# Patient Record
Sex: Female | Born: 1975 | Race: White | Hispanic: No | Marital: Married | Smoking: Never smoker
Health system: Southern US, Community
[De-identification: ages and names within clinical notes are randomized; demographics above are authoritative.]

## PROBLEM LIST (undated history)

## (undated) DIAGNOSIS — Z8719 Personal history of other diseases of the digestive system: Secondary | ICD-10-CM

## (undated) DIAGNOSIS — E049 Nontoxic goiter, unspecified: Secondary | ICD-10-CM

## (undated) DIAGNOSIS — F32A Depression, unspecified: Secondary | ICD-10-CM

## (undated) DIAGNOSIS — I10 Essential (primary) hypertension: Secondary | ICD-10-CM

## (undated) DIAGNOSIS — K589 Irritable bowel syndrome without diarrhea: Secondary | ICD-10-CM

## (undated) HISTORY — DX: Depression, unspecified: F32.A

## (undated) HISTORY — PX: ABLATION: SHX5711

## (undated) HISTORY — DX: Essential (primary) hypertension: I10

## (undated) HISTORY — PX: CHOLECYSTECTOMY: SHX55

## (undated) HISTORY — DX: Irritable bowel syndrome, unspecified: K58.9

## (undated) HISTORY — DX: Personal history of other diseases of the digestive system: Z87.19

---

## 2016-04-29 ENCOUNTER — Other Ambulatory Visit: Payer: Self-pay | Admitting: Nurse Practitioner

## 2016-04-29 DIAGNOSIS — Z1231 Encounter for screening mammogram for malignant neoplasm of breast: Secondary | ICD-10-CM

## 2016-06-10 ENCOUNTER — Ambulatory Visit
Admission: RE | Admit: 2016-06-10 | Discharge: 2016-06-10 | Disposition: A | Discharge status: Home / Self Care | Payer: BLUE CROSS/BLUE SHIELD | Source: Ambulatory Visit | Attending: Nurse Practitioner | Admitting: Nurse Practitioner

## 2016-06-10 DIAGNOSIS — Z1231 Encounter for screening mammogram for malignant neoplasm of breast: Secondary | ICD-10-CM

## 2016-07-05 ENCOUNTER — Other Ambulatory Visit: Payer: Self-pay | Admitting: Nurse Practitioner

## 2016-07-05 DIAGNOSIS — R928 Other abnormal and inconclusive findings on diagnostic imaging of breast: Secondary | ICD-10-CM

## 2016-07-08 ENCOUNTER — Other Ambulatory Visit: Payer: BLUE CROSS/BLUE SHIELD

## 2016-07-15 ENCOUNTER — Other Ambulatory Visit: Payer: Self-pay | Admitting: Nurse Practitioner

## 2016-07-15 ENCOUNTER — Ambulatory Visit
Admission: RE | Admit: 2016-07-15 | Discharge: 2016-07-15 | Disposition: A | Discharge status: Home / Self Care | Payer: BLUE CROSS/BLUE SHIELD | Source: Ambulatory Visit | Attending: Nurse Practitioner | Admitting: Nurse Practitioner

## 2016-07-15 DIAGNOSIS — R928 Other abnormal and inconclusive findings on diagnostic imaging of breast: Secondary | ICD-10-CM

## 2016-07-15 DIAGNOSIS — N6489 Other specified disorders of breast: Secondary | ICD-10-CM

## 2016-07-22 ENCOUNTER — Other Ambulatory Visit: Payer: Self-pay | Admitting: Nurse Practitioner

## 2016-07-22 ENCOUNTER — Ambulatory Visit
Admission: RE | Admit: 2016-07-22 | Discharge: 2016-07-22 | Disposition: A | Discharge status: Home / Self Care | Payer: BLUE CROSS/BLUE SHIELD | Source: Ambulatory Visit | Attending: Nurse Practitioner | Admitting: Nurse Practitioner

## 2016-07-22 DIAGNOSIS — N6489 Other specified disorders of breast: Secondary | ICD-10-CM

## 2016-08-05 ENCOUNTER — Ambulatory Visit: Payer: Self-pay | Admitting: General Surgery

## 2016-08-05 DIAGNOSIS — R921 Mammographic calcification found on diagnostic imaging of breast: Secondary | ICD-10-CM

## 2016-08-10 ENCOUNTER — Other Ambulatory Visit: Payer: Self-pay | Admitting: General Surgery

## 2016-08-10 DIAGNOSIS — R921 Mammographic calcification found on diagnostic imaging of breast: Secondary | ICD-10-CM

## 2016-08-19 HISTORY — PX: BREAST EXCISIONAL BIOPSY: SUR124

## 2016-09-01 NOTE — Pre-Procedure Instructions (Signed)
Janene Madeiraonya Crate  09/01/2016      [facility] [id number] - [place], [place] - [address] [facility] [address] [place] [id number] Phone: [phone] Fax: [phone]    Your procedure is scheduled on   Thursday  09/09/16  [facility] at [facility]30 A.M.  Call this number if you have problems the morning of surgery:  [phone]   Remember:  Do not eat food or drink liquids after midnight.  Take these medicines the morning of surgery with A SIP OF WATER   NONE       (STOP 7 DAYS PRIOR TO SURGERY ASPIRIN OR ASPIRIN PRODUCTS, IBUPROFEN/ ADVIL/ MOTRIN, GOODY POWDERS, BC'S, HERBAL MEDICINES)   Do not wear jewelry, make-up or nail polish.  Do not wear lotions, powders, or perfumes, or deoderant.  Do not shave 48 hours prior to surgery.  Men may shave face and neck.  Do not bring valuables to the hospital.  [facility] is not responsible for any belongings or valuables.  Contacts, dentures or bridgework may not be worn into surgery.  Leave your suitcase in the car.  After surgery it may be brought to your room.  For patients admitted to the hospital, discharge time will be determined by your treatment team.  Patients discharged the day of surgery will not be allowed to drive home.   Name and phone number of your driver:    Special instructions:  [place] - Preparing for Surgery  Before surgery, you can play an important role.  Because skin is not sterile, your skin needs to be as free of germs as possible.  You can reduce the number of germs on you skin by washing with CHG (chlorahexidine gluconate) soap before surgery.  CHG is an antiseptic cleaner which kills germs and bonds with the skin to continue killing germs even after washing.  Please DO NOT use if you have an allergy to CHG or antibacterial soaps.  If your skin becomes reddened/irritated stop using the CHG and inform your nurse when you arrive at Short  Stay.  Do not shave (including legs and underarms) for at least 48 hours prior to the first CHG shower.  You may shave your face.  Please follow these instructions carefully:   1.  Shower with CHG Soap the night before surgery and the                                morning of Surgery.  2.  If you choose to wash your hair, wash your hair first as usual with your       normal shampoo.  3.  After you shampoo, rinse your hair and body thoroughly to remove the                      Shampoo.  4.  Use CHG as you would any other liquid soap.  You can apply chg directly       to the skin and wash gently with scrungie or a clean washcloth.  5.  Apply the CHG Soap to your body ONLY FROM THE NECK DOWN.        Do not use on open wounds or open sores.  Avoid contact with your eyes,       ears, mouth and genitals (private parts).  Wash genitals (private parts)  with your normal soap.  6.  Wash thoroughly, paying special attention to the area where your surgery        will be performed.  7.  Thoroughly rinse your body with warm water from the neck down.  8.  DO NOT shower/wash with your normal soap after using and rinsing off       the CHG Soap.  9.  Pat yourself dry with a clean towel.            10.  Wear clean pajamas.            11.  Place clean sheets on your bed the night of your first shower and do not        sleep with pets.  Day of Surgery  Do not apply any lotions/deoderants the morning of surgery.  Please wear clean clothes to the hospital/surgery center.    Please read over the following fact sheets that you were given. Pain Booklet

## 2016-09-02 ENCOUNTER — Encounter (HOSPITAL_COMMUNITY): Payer: Self-pay | Admitting: *Deleted

## 2016-09-02 ENCOUNTER — Encounter (HOSPITAL_COMMUNITY)
Admission: RE | Admit: 2016-09-02 | Discharge: 2016-09-02 | Disposition: A | Discharge status: Home / Self Care | Payer: BLUE CROSS/BLUE SHIELD | Source: Ambulatory Visit | Attending: General Surgery | Admitting: General Surgery

## 2016-09-02 DIAGNOSIS — Z01812 Encounter for preprocedural laboratory examination: Secondary | ICD-10-CM | POA: Diagnosis not present

## 2016-09-02 HISTORY — DX: Nontoxic goiter, unspecified: E04.9

## 2016-09-02 LAB — CBC
HCT: 39.7 % (ref 36.0–46.0)
Hemoglobin: 13.3 g/dL (ref 12.0–15.0)
MCH: 31.1 pg (ref 26.0–34.0)
MCHC: 33.5 g/dL (ref 30.0–36.0)
MCV: 92.8 fL (ref 78.0–100.0)
PLATELETS: 226 10*3/uL (ref 150–400)
RBC: 4.28 MIL/uL (ref 3.87–5.11)
RDW: 12.4 % (ref 11.5–15.5)
WBC: 5 10*3/uL (ref 4.0–10.5)

## 2016-09-02 LAB — HCG, SERUM, QUALITATIVE: Preg, Serum: NEGATIVE

## 2016-09-02 NOTE — Progress Notes (Signed)
PCP: Belva AgeeSusan Weeks, PA @ [facility] [place], [place]

## 2016-09-08 ENCOUNTER — Ambulatory Visit
Admission: RE | Admit: 2016-09-08 | Discharge: 2016-09-08 | Disposition: A | Discharge status: Home / Self Care | Payer: BLUE CROSS/BLUE SHIELD | Source: Ambulatory Visit | Attending: General Surgery | Admitting: General Surgery

## 2016-09-08 DIAGNOSIS — R921 Mammographic calcification found on diagnostic imaging of breast: Secondary | ICD-10-CM

## 2016-09-09 ENCOUNTER — Ambulatory Visit
Admission: RE | Admit: 2016-09-09 | Discharge: 2016-09-09 | Disposition: A | Discharge status: Home / Self Care | Payer: BLUE CROSS/BLUE SHIELD | Source: Ambulatory Visit | Attending: General Surgery | Admitting: General Surgery

## 2016-09-09 ENCOUNTER — Encounter (HOSPITAL_COMMUNITY)
Admission: RE | Disposition: A | Discharge status: Home / Self Care | Payer: Self-pay | Source: Ambulatory Visit | Attending: General Surgery

## 2016-09-09 ENCOUNTER — Encounter (HOSPITAL_COMMUNITY): Payer: Self-pay | Admitting: *Deleted

## 2016-09-09 ENCOUNTER — Ambulatory Visit (HOSPITAL_COMMUNITY)
Admission: RE | Admit: 2016-09-09 | Discharge: 2016-09-09 | Disposition: A | Discharge status: Home / Self Care | Payer: BLUE CROSS/BLUE SHIELD | Source: Ambulatory Visit | Attending: General Surgery | Admitting: General Surgery

## 2016-09-09 ENCOUNTER — Ambulatory Visit (HOSPITAL_COMMUNITY): Payer: BLUE CROSS/BLUE SHIELD | Admitting: Anesthesiology

## 2016-09-09 DIAGNOSIS — Z803 Family history of malignant neoplasm of breast: Secondary | ICD-10-CM | POA: Diagnosis not present

## 2016-09-09 DIAGNOSIS — N6092 Unspecified benign mammary dysplasia of left breast: Secondary | ICD-10-CM | POA: Insufficient documentation

## 2016-09-09 DIAGNOSIS — N6489 Other specified disorders of breast: Secondary | ICD-10-CM | POA: Insufficient documentation

## 2016-09-09 DIAGNOSIS — R921 Mammographic calcification found on diagnostic imaging of breast: Secondary | ICD-10-CM | POA: Diagnosis present

## 2016-09-09 HISTORY — PX: BREAST LUMPECTOMY WITH RADIOACTIVE SEED LOCALIZATION: SHX6424

## 2016-09-09 SURGERY — BREAST LUMPECTOMY WITH RADIOACTIVE SEED LOCALIZATION
Anesthesia: General | Site: Breast | Laterality: Left

## 2016-09-09 MED ORDER — LACTATED RINGERS IV SOLN
INTRAVENOUS | Status: DC
  Administered 2016-09-09: 09:00:00 via INTRAVENOUS

## 2016-09-09 MED ORDER — CELECOXIB 200 MG PO CAPS
ORAL_CAPSULE | ORAL | Status: AC
  Filled 2016-09-09: qty 2

## 2016-09-09 MED ORDER — 0.9 % SODIUM CHLORIDE (POUR BTL) OPTIME
TOPICAL | Status: DC | PRN
  Administered 2016-09-09: 1000 mL

## 2016-09-09 MED ORDER — DEXAMETHASONE SODIUM PHOSPHATE 10 MG/ML IJ SOLN
INTRAMUSCULAR | Status: AC
  Filled 2016-09-09: qty 1

## 2016-09-09 MED ORDER — SCOPOLAMINE 1 MG/3DAYS TD PT72
MEDICATED_PATCH | TRANSDERMAL | Status: AC
  Filled 2016-09-09: qty 1

## 2016-09-09 MED ORDER — CEFAZOLIN SODIUM-DEXTROSE 2-4 GM/100ML-% IV SOLN
2.0000 g | INTRAVENOUS | Status: AC
  Administered 2016-09-09: 2 g via INTRAVENOUS

## 2016-09-09 MED ORDER — FENTANYL CITRATE (PF) 100 MCG/2ML IJ SOLN
INTRAMUSCULAR | Status: DC | PRN
  Administered 2016-09-09: 25 ug via INTRAVENOUS
  Administered 2016-09-09: 75 ug via INTRAVENOUS

## 2016-09-09 MED ORDER — MIDAZOLAM HCL 2 MG/2ML IJ SOLN
INTRAMUSCULAR | Status: AC
  Filled 2016-09-09: qty 2

## 2016-09-09 MED ORDER — CHLORHEXIDINE GLUCONATE CLOTH 2 % EX PADS: 6.0000 | MEDICATED_PAD | Freq: Once | CUTANEOUS | Status: DC

## 2016-09-09 MED ORDER — GABAPENTIN 300 MG PO CAPS
ORAL_CAPSULE | ORAL | Status: AC
  Filled 2016-09-09: qty 1

## 2016-09-09 MED ORDER — SCOPOLAMINE 1 MG/3DAYS TD PT72
1.0000 | MEDICATED_PATCH | TRANSDERMAL | Status: DC
  Administered 2016-09-09: 1.5 mg via TRANSDERMAL
  Administered 2016-09-09: 1 via TRANSDERMAL
  Filled 2016-09-09: qty 1

## 2016-09-09 MED ORDER — PROPOFOL 10 MG/ML IV BOLUS
INTRAVENOUS | Status: DC | PRN
  Administered 2016-09-09: 50 mg via INTRAVENOUS
  Administered 2016-09-09: 150 mg via INTRAVENOUS

## 2016-09-09 MED ORDER — LACTATED RINGERS IV SOLN: INTRAVENOUS | Status: DC

## 2016-09-09 MED ORDER — GABAPENTIN 300 MG PO CAPS
300.0000 mg | ORAL_CAPSULE | ORAL | Status: AC
  Administered 2016-09-09: 300 mg via ORAL

## 2016-09-09 MED ORDER — PROMETHAZINE HCL 25 MG/ML IJ SOLN: 6.2500 mg | INTRAMUSCULAR | Status: DC | PRN

## 2016-09-09 MED ORDER — PROPOFOL 10 MG/ML IV BOLUS
INTRAVENOUS | Status: AC
  Filled 2016-09-09: qty 20

## 2016-09-09 MED ORDER — BUPIVACAINE HCL (PF) 0.25 % IJ SOLN
INTRAMUSCULAR | Status: DC | PRN
  Administered 2016-09-09: 30 mL

## 2016-09-09 MED ORDER — FENTANYL CITRATE (PF) 100 MCG/2ML IJ SOLN
INTRAMUSCULAR | Status: AC
  Filled 2016-09-09: qty 2

## 2016-09-09 MED ORDER — ACETAMINOPHEN 500 MG PO TABS
ORAL_TABLET | ORAL | Status: AC
  Filled 2016-09-09: qty 2

## 2016-09-09 MED ORDER — FENTANYL CITRATE (PF) 100 MCG/2ML IJ SOLN: 25.0000 ug | INTRAMUSCULAR | Status: DC | PRN

## 2016-09-09 MED ORDER — LIDOCAINE 2% (20 MG/ML) 5 ML SYRINGE
INTRAMUSCULAR | Status: DC | PRN
  Administered 2016-09-09: 100 mg via INTRAVENOUS

## 2016-09-09 MED ORDER — BUPIVACAINE HCL (PF) 0.25 % IJ SOLN
INTRAMUSCULAR | Status: AC
  Filled 2016-09-09: qty 30

## 2016-09-09 MED ORDER — HYDROCODONE-ACETAMINOPHEN 5-325 MG PO TABS: 1.0000 | ORAL_TABLET | ORAL | 0 refills | Status: DC | PRN

## 2016-09-09 MED ORDER — MIDAZOLAM HCL 5 MG/5ML IJ SOLN
INTRAMUSCULAR | Status: DC | PRN
  Administered 2016-09-09: 2 mg via INTRAVENOUS

## 2016-09-09 MED ORDER — DEXAMETHASONE SODIUM PHOSPHATE 10 MG/ML IJ SOLN
INTRAMUSCULAR | Status: DC | PRN
  Administered 2016-09-09: 10 mg via INTRAVENOUS

## 2016-09-09 MED ORDER — CELECOXIB 200 MG PO CAPS
400.0000 mg | ORAL_CAPSULE | ORAL | Status: AC
  Administered 2016-09-09: 400 mg via ORAL

## 2016-09-09 MED ORDER — ACETAMINOPHEN 500 MG PO TABS
1000.0000 mg | ORAL_TABLET | ORAL | Status: AC
  Administered 2016-09-09: 1000 mg via ORAL

## 2016-09-09 MED ORDER — ONDANSETRON HCL 4 MG/2ML IJ SOLN
INTRAMUSCULAR | Status: DC | PRN
  Administered 2016-09-09: 4 mg via INTRAVENOUS

## 2016-09-09 MED ORDER — CEFAZOLIN SODIUM-DEXTROSE 2-4 GM/100ML-% IV SOLN
INTRAVENOUS | Status: AC
  Filled 2016-09-09: qty 100

## 2016-09-09 SURGICAL SUPPLY — 43 items
APPLIER CLIP 9.375 MED OPEN (MISCELLANEOUS)
BLADE SURG 15 STRL LF DISP TIS (BLADE) ×1 IMPLANT
BLADE SURG 15 STRL SS (BLADE) ×2
CANISTER SUCT 3000ML PPV (MISCELLANEOUS) ×3 IMPLANT
CHLORAPREP W/TINT 26ML (MISCELLANEOUS) ×3 IMPLANT
CLIP APPLIE 9.375 MED OPEN (MISCELLANEOUS) IMPLANT
CONT SPEC 4OZ CLIKSEAL STRL BL (MISCELLANEOUS) ×6 IMPLANT
COVER PROBE W GEL 5X96 (DRAPES) ×3 IMPLANT
COVER SURGICAL LIGHT HANDLE (MISCELLANEOUS) ×3 IMPLANT
DERMABOND ADVANCED (GAUZE/BANDAGES/DRESSINGS) ×2
DERMABOND ADVANCED .7 DNX12 (GAUZE/BANDAGES/DRESSINGS) ×1 IMPLANT
DEVICE DUBIN SPECIMEN MAMMOGRA (MISCELLANEOUS) ×3 IMPLANT
DRAPE CHEST BREAST 15X10 FENES (DRAPES) ×3 IMPLANT
DRAPE UTILITY XL STRL (DRAPES) ×3 IMPLANT
ELECT COATED BLADE 2.86 ST (ELECTRODE) ×3 IMPLANT
ELECT REM PT RETURN 9FT ADLT (ELECTROSURGICAL) ×3
ELECTRODE REM PT RTRN 9FT ADLT (ELECTROSURGICAL) ×1 IMPLANT
GLOVE BIO SURGEON STRL SZ 6.5 (GLOVE) ×2 IMPLANT
GLOVE BIO SURGEON STRL SZ7.5 (GLOVE) ×6 IMPLANT
GLOVE BIO SURGEONS STRL SZ 6.5 (GLOVE) ×1
GLOVE BIOGEL PI IND STRL 7.0 (GLOVE) ×1 IMPLANT
GLOVE BIOGEL PI INDICATOR 7.0 (GLOVE) ×2
GLOVE SURG SS PI 6.5 STRL IVOR (GLOVE) ×3 IMPLANT
GOWN STRL REUS W/ TWL LRG LVL3 (GOWN DISPOSABLE) ×3 IMPLANT
GOWN STRL REUS W/TWL LRG LVL3 (GOWN DISPOSABLE) ×6
KIT BASIN OR (CUSTOM PROCEDURE TRAY) ×3 IMPLANT
KIT MARKER MARGIN INK (KITS) ×3 IMPLANT
LIGHT WAVEGUIDE WIDE FLAT (MISCELLANEOUS) IMPLANT
NEEDLE HYPO 25X1 1.5 SAFETY (NEEDLE) ×3 IMPLANT
NS IRRIG 1000ML POUR BTL (IV SOLUTION) ×3 IMPLANT
PACK SURGICAL SETUP 50X90 (CUSTOM PROCEDURE TRAY) ×3 IMPLANT
PENCIL BUTTON HOLSTER BLD 10FT (ELECTRODE) ×3 IMPLANT
SPONGE LAP 18X18 X RAY DECT (DISPOSABLE) ×3 IMPLANT
SUT MNCRL AB 4-0 PS2 18 (SUTURE) ×6 IMPLANT
SUT SILK 2 0 SH (SUTURE) IMPLANT
SUT VIC AB 3-0 SH 18 (SUTURE) ×3 IMPLANT
SYR BULB 3OZ (MISCELLANEOUS) ×3 IMPLANT
SYR CONTROL 10ML LL (SYRINGE) ×3 IMPLANT
TOWEL OR 17X24 6PK STRL BLUE (TOWEL DISPOSABLE) IMPLANT
TOWEL OR 17X26 10 PK STRL BLUE (TOWEL DISPOSABLE) ×3 IMPLANT
TUBE CONNECTING 12'X1/4 (SUCTIONS) ×1
TUBE CONNECTING 12X1/4 (SUCTIONS) ×2 IMPLANT
YANKAUER SUCT BULB TIP NO VENT (SUCTIONS) ×3 IMPLANT

## 2016-09-09 NOTE — Transfer of Care (Signed)
Immediate Anesthesia Transfer of Care Note  Patient: Ruth Allison  Procedure(s) Performed: Procedure(s): LEFT BREAST LUMPECTOMY WITH RADIOACTIVE SEED LOCALIZATION (Left)  Patient Location: PACU  Anesthesia Type:General  Level of Consciousness: awake, alert , oriented and patient cooperative  Airway & Oxygen Therapy: Patient Spontanous Breathing  Post-op Assessment: Report given to RN and Post -op Vital signs reviewed and stable  Post vital signs: Reviewed and stable  Last Vitals:  Vitals:   09/09/16 0824 09/09/16 1303  BP: (!) 144/80 (!) (P) 146/94  Pulse: 91   Resp: 20   Temp: 36.8 C (P) 36.7 C    Last Pain:  Vitals:   09/09/16 0824  TempSrc: Oral         Complications: No apparent anesthesia complications

## 2016-09-09 NOTE — Progress Notes (Signed)
Care of pt assumed by MA Avish Torry RN 

## 2016-09-09 NOTE — H&P (Signed)
Ruth Allison  Location: [facility] Patient #: [id number] DOB: 01/27/1976 Undefined / Language: [place] / Race: White Female   History of Present Illness  The patient is a 41 year old female who presents with a breast mass. We are asked to see the patient in consultation by Dr. Darl PikesSusan weeks to evaluate her for a discordant biopsy. The patient is a 41 year old white female who recently went for a routine screening mammogram. At that time she was found to have some abnormal calcifications present in the upper portion of the left breast. She states that she has been told she had some calcifications on previous mammograms. She denies any breast pain or discharge from the nipple. The area of calcification covered about 7 cm. Part of this area was biopsied and showed fibrocystic disease with calcification. The radiologist felt this was discordant and felt that a larger resection should be done. She does have a family history of breast cancer in 2 paternal aunts. She does not smoke or take hormone replacement.   Past Surgical History  Breast Biopsy  Left. Gallbladder Surgery - Laparoscopic  Oral Surgery   Diagnostic Studies History  Colonoscopy  never Mammogram  within last year Pap Smear  1-5 years ago  Allergies  No Known Allergies   Medication History  Lexapro (10MG  Tablet, Oral daily) Active. Medications Reconciled  Social History  Alcohol use  Occasional alcohol use. Caffeine use  Coffee. No drug use  Tobacco use  Never smoker.  Family History  Hypertension  Father, Mother. Migraine Headache  Mother.  Pregnancy / Birth History  Age at menarche  11 years. Contraceptive History  Oral contraceptives. Gravida  1 Irregular periods  Maternal age  43-30 Para  1  Other Problems  Cholelithiasis  Thyroid Disease     Review of Systems  General Not Present- Appetite Loss, Chills, Fatigue, Fever, Night Sweats, Weight Gain and Weight  Loss. Skin Not Present- Change in Wart/Mole, Dryness, Hives, Jaundice, New Lesions, Non-Healing Wounds, Rash and Ulcer. HEENT Present- Seasonal Allergies and Wears glasses/contact lenses. Not Present- Earache, Hearing Loss, Hoarseness, Nose Bleed, Oral Ulcers, Ringing in the Ears, Sinus Pain, Sore Throat, Visual Disturbances and Yellow Eyes. Respiratory Not Present- Bloody sputum, Chronic Cough, Difficulty Breathing, Snoring and Wheezing. Breast Not Present- Breast Mass, Breast Pain, Nipple Discharge and Skin Changes. Cardiovascular Not Present- Chest Pain, Difficulty Breathing Lying Down, Leg Cramps, Palpitations, Rapid Heart Rate, Shortness of Breath and Swelling of Extremities. Gastrointestinal Not Present- Abdominal Pain, Bloating, Bloody Stool, Change in Bowel Habits, Chronic diarrhea, Constipation, Difficulty Swallowing, Excessive gas, Gets full quickly at meals, Hemorrhoids, Indigestion, Nausea, Rectal Pain and Vomiting. Female Genitourinary Not Present- Frequency, Nocturia, Painful Urination, Pelvic Pain and Urgency. Musculoskeletal Not Present- Back Pain, Joint Pain, Joint Stiffness, Muscle Pain, Muscle Weakness and Swelling of Extremities. Neurological Not Present- Decreased Memory, Fainting, Headaches, Numbness, Seizures, Tingling, Tremor, Trouble walking and Weakness. Psychiatric Not Present- Anxiety, Bipolar, Change in Sleep Pattern, Depression, Fearful and Frequent crying. Endocrine Not Present- Cold Intolerance, Excessive Hunger, Hair Changes, Heat Intolerance, Hot flashes and New Diabetes. Hematology Not Present- Blood Thinners, Easy Bruising, Excessive bleeding, Gland problems, HIV and Persistent Infections.  Vitals  Weight: 154 lb Height: 64in Body Surface Area: 1.75 m Body Mass Index: 26.43 kg/m  Temp.: 98.3F  Pulse: 82 (Regular)  BP: 138/82 (Sitting, Left Arm, Standard)       Physical Exam General Mental Status-Alert. General Appearance-Consistent  with stated age. Hydration-Well hydrated. Voice-Normal.  Head and Neck Head-normocephalic, atraumatic  with no lesions or palpable masses. Trachea-midline. Thyroid Gland Characteristics - normal size and consistency.  Eye Eyeball - Bilateral-Extraocular movements intact. Sclera/Conjunctiva - Bilateral-No scleral icterus.  Chest and Lung Exam Chest and lung exam reveals -quiet, even and easy respiratory effort with no use of accessory muscles and on auscultation, normal breath sounds, no adventitious sounds and normal vocal resonance. Inspection Chest Wall - Normal. Back - normal.  Breast Note: There is no palpable mass in either breast. There is no palpable axillary, supraclavicular, or cervical lymphadenopathy.   Cardiovascular Cardiovascular examination reveals -normal heart sounds, regular rate and rhythm with no murmurs and normal pedal pulses bilaterally.  Abdomen Inspection Inspection of the abdomen reveals - No Hernias. Skin - Scar - no surgical scars. Palpation/Percussion Palpation and Percussion of the abdomen reveal - Soft, Non Tender, No Rebound tenderness, No Rigidity (guarding) and No hepatosplenomegaly. Auscultation Auscultation of the abdomen reveals - Bowel sounds normal.  Neurologic Neurologic evaluation reveals -alert and oriented x 3 with no impairment of recent or remote memory. Mental Status-Normal.  Musculoskeletal Normal Exam - Left-Upper Extremity Strength Normal and Lower Extremity Strength Normal. Normal Exam - Right-Upper Extremity Strength Normal and Lower Extremity Strength Normal.  Lymphatic Head & Neck  General Head & Neck Lymphatics: Bilateral - Description - Normal. Axillary  General Axillary Region: Bilateral - Description - Normal. Tenderness - Non Tender. Femoral & Inguinal  Generalized Femoral & Inguinal Lymphatics: Bilateral - Description - Normal. Tenderness - Non Tender.    Assessment & Plan   BREAST CALCIFICATION, LEFT (R92.1) Impression: The patient appears to have some abnormal calcifications in the upper portion of the left breast covering an area of about 7 cm. This was biopsied and found to be fibrocystic disease with calcifications but the radiologist felt that the results were discordant. Because of this the recommendation is to have more of this area removed. I believe this is a reasonable thing to do but I do not think the entire 7 cm area of calcification needs to be removed if this all appears to be benign. She would require a left breast radioactive seed localized lumpectomy. I have discussed with her in detail the risks and benefits of the operation as well as some of the technical aspects and she understands and wishes to proceed. If a representative section of this area is all benign then I think the calcifications may be able to be followed with imaging. Given her breast density she may also require MRI as part of her screening.

## 2016-09-09 NOTE — Op Note (Signed)
09/09/2016  12:53 PM  PATIENT:  Ruth Allison  41 y.o. female  PRE-OPERATIVE DIAGNOSIS:  LEFT BREAST CALCIFICATIONS  POST-OPERATIVE DIAGNOSIS:  LEFT BREAST CALCIFICATIONS  PROCEDURE:  Procedure(s): LEFT BREAST LUMPECTOMY WITH RADIOACTIVE SEED LOCALIZATION (Left)  SURGEON:  Surgeon(s) and Role:    * Griselda MinerPaul Toth III, MD - Primary  PHYSICIAN ASSISTANT:   ASSISTANTS: none   ANESTHESIA:   local and general  EBL:  No intake/output data recorded.  BLOOD ADMINISTERED:none  DRAINS: none   LOCAL MEDICATIONS USED:  MARCAINE     SPECIMEN:  Source of Specimen:  left breast tissue with additional deep and deep lateral margins  DISPOSITION OF SPECIMEN:  PATHOLOGY  COUNTS:  YES  TOURNIQUET:  * No tourniquets in log *  DICTATION: .Dragon Dictation   After informed consent was obtained the patient was brought to the operating room and placed in the supine position on the operating room table. After adequate induction of general anesthesia the patient's left breast was prepped with ChloraPrep, allowed to dry, and draped in usual sterile manner. An appropriate Timeout was performed. Previously an I-125 seed was placed in the upper outer quadrant of the left breast to mark an area of calcification that was previously biopsied and thought to be benign but was felt to be discordant by the radiologist. The neoprobe was set to I-125 in the area of radioactivity was readily identified. I decided to access this area from the lateral edge of the breast. This area was infiltrated with quarter percent Marcaine. A small vertically oriented incision was made on the very lateral aspect of the breast in the axilla with a 15 blade knife. The incision was carried through the skin and subcutaneous tissue sharply with electrocautery. Dissection was then carried towards the radioactive seed under the direction of the neoprobe. Once I approached the radioactive seed and then removed a circular portion of breast tissue  sharply around the radioactive seed with the electrocautery. Once the specimen was removed it was oriented with the appropriate paint colors. A specimen radiograph was obtained that showed the clip and seed to be within the specimen. After consultation with the radiologist I decided to take an additional deep and deep lateral margin. These were marked appropriately. Hemostasis was achieved using the Bovie electrocautery. The wound was then infiltrated with quarter percent Marcaine and irrigated with saline. The deep layer of the wound was then closed with layers of interrupted 3-0 Vicryl stitches. The skin was then closed with interrupted 4-0 Monocryl subcuticular stitches. Dermabond dressings were applied. The patient tolerated the procedure well. At the end of the case all needle sponge and instrument counts were correct. The patient was then awakened and taken to recovery in stable condition.  PLAN OF CARE: Discharge to home after PACU  PATIENT DISPOSITION:  PACU - hemodynamically stable.   Delay start of Pharmacological VTE agent (>24hrs) due to surgical blood loss or risk of bleeding: not applicable

## 2016-09-09 NOTE — Anesthesia Preprocedure Evaluation (Addendum)
Anesthesia Evaluation  Patient identified by MRN, date of birth, ID band Patient awake    Reviewed: Allergy & Precautions, NPO status , Patient's Chart, lab work & pertinent test results  Airway Mallampati: II  TM Distance: >3 FB Neck ROM: Full    Dental  (+) Teeth Intact, Dental Advisory Given   Pulmonary neg pulmonary ROS,    Pulmonary exam normal breath sounds clear to auscultation       Cardiovascular Exercise Tolerance: Good negative cardio ROS Normal cardiovascular exam Rhythm:Regular Rate:Normal     Neuro/Psych PSYCHIATRIC DISORDERS Depression negative neurological ROS     GI/Hepatic negative GI ROS, Neg liver ROS,   Endo/Other  negative endocrine ROS  Renal/GU negative Renal ROS     Musculoskeletal negative musculoskeletal ROS (+)   Abdominal   Peds  Hematology negative hematology ROS (+)   Anesthesia Other Findings Day of surgery medications reviewed with the patient.  Reproductive/Obstetrics negative OB ROS                            Anesthesia Physical Anesthesia Plan  ASA: II  Anesthesia Plan: General   Post-op Pain Management:    Induction: Intravenous  Airway Management Planned: LMA  Additional Equipment:   Intra-op Plan:   Post-operative Plan: Extubation in OR  Informed Consent: I have reviewed the patients History and Physical, chart, labs and discussed the procedure including the risks, benefits and alternatives for the proposed anesthesia with the patient or authorized representative who has indicated his/her understanding and acceptance.   Dental advisory given  Plan Discussed with: CRNA  Anesthesia Plan Comments: (Risks/benefits of general anesthesia discussed with patient including risk of damage to teeth, lips, gum, and tongue, nausea/vomiting, allergic reactions to medications, and the possibility of heart attack, stroke and death.  All patient  questions answered.  Patient wishes to proceed.)        Anesthesia Quick Evaluation

## 2016-09-09 NOTE — Anesthesia Procedure Notes (Addendum)
Procedure Name: LMA Insertion Date/Time: 09/09/2016 11:37 AM Performed by: Faustino CongressWHITE, Neko Boyajian TENA Davison Ohms Pre-anesthesia Checklist: Patient identified, Emergency Drugs available, Suction available and Patient being monitored Patient Re-evaluated:Patient Re-evaluated prior to inductionOxygen Delivery Method: Circle System Utilized Preoxygenation: Pre-oxygenation with 100% oxygen Intubation Type: IV induction LMA: LMA inserted LMA Size: 4.0 Number of attempts: 1 Airway Equipment and Method: Bite block Placement Confirmation: positive ETCO2 Tube secured with: Tape Dental Injury: Teeth and Oropharynx as per pre-operative assessment  Comments: LMA placed by Elliot Dallyiana Huggins, SRNA

## 2016-09-09 NOTE — Interval H&P Note (Signed)
History and Physical Interval Note:  09/09/2016 10:00 AM  Ruth Allison  has presented today for surgery, with the diagnosis of LEFT BREAST CALCIFICATIONS  The various methods of treatment have been discussed with the patient and family. After consideration of risks, benefits and other options for treatment, the patient has consented to  Procedure(s): LEFT BREAST LUMPECTOMY WITH RADIOACTIVE SEED LOCALIZATION (Left) as a surgical intervention .  The patient's history has been reviewed, patient examined, no change in status, stable for surgery.  I have reviewed the patient's chart and labs.  Questions were answered to the patient's satisfaction.     TOTH III,PAUL S

## 2016-09-10 ENCOUNTER — Encounter (HOSPITAL_COMMUNITY): Payer: Self-pay | Admitting: General Surgery

## 2016-09-10 NOTE — Anesthesia Postprocedure Evaluation (Signed)
Anesthesia Post Note  Patient: Janene Madeiraonya Tunison  Procedure(s) Performed: Procedure(s) (LRB): LEFT BREAST LUMPECTOMY WITH RADIOACTIVE SEED LOCALIZATION (Left)  Patient location during evaluation: PACU Anesthesia Type: General Level of consciousness: awake and alert Pain management: pain level controlled Vital Signs Assessment: post-procedure vital signs reviewed and stable Respiratory status: spontaneous breathing, nonlabored ventilation, respiratory function stable and patient connected to nasal cannula oxygen Cardiovascular status: blood pressure returned to baseline and stable Postop Assessment: no signs of nausea or vomiting Anesthetic complications: no       Last Vitals:  Vitals:   09/09/16 1317 09/09/16 1330  BP: (!) 142/92   Pulse: 81 74  Resp: 14 13  Temp:  36.7 C    Last Pain:  Vitals:   09/09/16 0824  TempSrc: Oral                 Cecile HearingStephen Edward Turk

## 2017-06-15 ENCOUNTER — Other Ambulatory Visit: Payer: Self-pay | Admitting: General Surgery

## 2017-06-15 DIAGNOSIS — Z139 Encounter for screening, unspecified: Secondary | ICD-10-CM

## 2017-07-01 ENCOUNTER — Other Ambulatory Visit: Payer: Self-pay | Admitting: General Surgery

## 2017-07-01 DIAGNOSIS — Z1231 Encounter for screening mammogram for malignant neoplasm of breast: Secondary | ICD-10-CM

## 2017-07-06 NOTE — Progress Notes (Signed)
[facility]  Telephone:(336) [phone] Fax:(336) [phone]     ID: Ruth Allison DOB: 11/15/1975  MR#: [id number]  JYN#:[id number]#:[id number]  Patient Care Team: Marrian SalvageGarrett, Ruth, Cordelia PochePA-C as PCP - General (Physician Assistant) Tashima Scarpulla, Valentino HueGustav C, MD as Consulting Physician (Oncology) Griselda Mineroth, Paul III, MD as Consulting Physician (General Surgery) Josephine CablesBivens, Carl, MD as Referring Physician (Internal Medicine) OTHER MD:  CHIEF COMPLAINT: Atypical lobular hyperplasia  CURRENT TREATMENT: Tamoxifen   HISTORY OF CURRENT ILLNESS: Ruth Madeiraonya Allison had routine screening mammography on 06/10/2016 showing a possible abnormality in the left breast. She underwent bilateral diagnostic mammography with tomography and left breast ultrasonography at [facility] on 07/15/2016 showing: Architectural distortion with associated calcifications, spanning approximately 7 x 6 cm, in the upper-outer quadrant of the left breast, at posterior depth, without sonographic correlate. Left axilla was evaluated with ultrasound showing no enlarged or morphologically abnormal lymph nodes.  Accordingly on 07/22/2016 she proceeded to biopsy of the left UOQ breast lession in question. The pathology from this procedure showed ([id number]): fibrocytic changes with calcifications. There is no malignancy identified. -- This was found to be discordant by radiology.  She then underwent a left breast lumpectomy and biopsy of the posterior and lateral margins ([id number]) on 09/09/2016 showing: Complex sclerosing lesion with calcifications. Focal lobular neoplasia (atypical lobular hyperplasia). Fibrocytic change and ususal ductal hyperplasia with calcifications. Pseudoangiomatous stromal hyperplasia (PASH). No malignancy identified.   The patient's subsequent history is as detailed below.  INTERVAL HISTORY: Ruth Allison was evaluated in the high risk clinic on 07/07/2017 accompanied by her husband, Ruth MostCharles.  REVIEW OF SYSTEMS: Ruth Allison reports that  she has had mild reflux and IBS in the past. She reports that she take stool softeners occasionally. She notes that they discontinued the medication that she was taking for it, and she is currently not taking medication. She notes that she uses her PCP Ruth ParadiseKaren Allison as her gynecologist as well. She denies unusual headaches, visual changes, nausea, vomiting, or dizziness. There has been no unusual cough, phlegm production, or pleurisy. This been no change in bowel or bladder habits. She denies unexplained fatigue or unexplained weight loss, bleeding, rash, or fever. A detailed review of systems was otherwise stable.    PAST MEDICAL HISTORY: Past Medical History:  Diagnosis Date  . Goiter   She reports HTN and anxiety. She has enlarged thyroids that she follows up with Internal medicine Dr. Jolyne [place]iv[place]. She has mild reflux issues. She notes having IBS years ago.   PAST SURGICAL HISTORY: Past Surgical History:  Procedure Laterality Date  . ABLATION    . BREAST LUMPECTOMY WITH RADIOACTIVE SEED LOCALIZATION Left 09/09/2016   Procedure: LEFT BREAST LUMPECTOMY WITH RADIOACTIVE SEED LOCALIZATION;  Surgeon: Chevis PrettyPaul Toth III, MD;  Location: [facility] OR;  Service: General;  Laterality: Left;  . CHOLECYSTECTOMY    Ablasion around 2013. She had her gall bladder removed. She still has her appendix, tonsils, uterus and ovaries.  FAMILY HISTORY No family history on file. Her father is alive at 4167 with no medical issues. Her father had 4 sisters, 2 of whom had breast cancer diagnosed in their 7650's and have passed away. He also had 5 brothers, none of whom had prostate or pancreatic cancer. Her mother alive at 365 with no history of cancer. Her mother has 2 siblings; one brother had prostate cancer. She denies other breast or ovarian cancers in the family.    GYNECOLOGIC HISTORY:  No LMP recorded. Patient has had an ablation.   Menarche: 42  years old Age at first live birth: 42 years old GXP1 LMP: 2013 She  had an ablation with no periods since/ minor spotting Contraceptive:no HRT: no    SOCIAL HISTORY:  She is a Armed forces operational officer. Her husband, Ruth Allison is a Conservator, museum/gallery for the [employer]. Their son, Ruth Allison is 93 years old. She notes that he is exceptional in school.  She notes that she visits church, but doesn't belong to one.     ADVANCED DIRECTIVES:    HEALTH MAINTENANCE: Social History   Tobacco Use  . Smoking status: Never Smoker  . Smokeless tobacco: Never Used  Substance Use Topics  . Alcohol use: Yes    Comment: 2-3 times a week, total 6 drinks  . Drug use: No     Colonoscopy: no  PAP: August 2018 normal  Bone density: no   No Known Allergies  Current Outpatient Medications  Medication Sig Dispense Refill  . docusate sodium (STOOL SOFTENER) 100 MG capsule Take 200 mg by mouth daily as needed for mild constipation.    Marland Kitchen escitalopram (LEXAPRO) 10 MG tablet Take 10 mg by mouth every evening.  11  . ibuprofen (ADVIL,MOTRIN) 200 MG tablet Take 400 mg by mouth every 6 (six) hours as needed for mild pain or moderate pain.    Marland Kitchen losartan (COZAAR) 50 MG tablet TK 1 T PO QD  3   No current facility-administered medications for this visit.     OBJECTIVE: Young white woman in no acute distress  Vitals:   07/07/17 1555  BP: 130/90  Pulse: 86  Resp: 18  Temp: 98.6 F (37 C)  SpO2: 100%     Body mass index is 28.91 kg/m.   Wt Readings from Last 3 Encounters:  07/07/17 168 lb 6.4 oz (76.4 kg)  09/09/16 155 lb (70.3 kg)  09/02/16 155 lb 11.2 oz (70.6 kg)      ECOG FS:0 - Asymptomatic  Ocular: Sclerae unicteric, pupils round and equal Ear-nose-throat: Oropharynx clear and moist Lymphatic: No cervical or supraclavicular adenopathy Lungs no rales or rhonchi Heart regular rate and rhythm Abd soft, nontender, positive bowel sounds MSK no focal spinal tenderness, no joint edema Neuro: non-focal, well-oriented, appropriate affect Breasts: The right breast is  unremarkable.  The left breast is status post lumpectomy.  The incision has healed very nicely.  The cosmetic result is excellent.  There are no suspicious findings.  Both axillae are benign.   LAB RESULTS:  CMP  No results found for: NA, K, CL, CO2, GLUCOSE, BUN, CREATININE, CALCIUM, PROT, ALBUMIN, AST, ALT, ALKPHOS, BILITOT, GFRNONAA, GFRAA  No results found for: TOTALPROTELP, ALBUMINELP, A1GS, A2GS, BETS, BETA2SER, GAMS, MSPIKE, SPEI  No results found for: KPAFRELGTCHN, LAMBDASER, KAPLAMBRATIO  Lab Results  Component Value Date   WBC 5.0 09/02/2016   HGB 13.3 09/02/2016   HCT 39.7 09/02/2016   MCV 92.8 09/02/2016   PLT 226 09/02/2016    @LASTCHEMISTRY @  No results found for: LABCA2  No components found for: ZHYQMV784  No results for input(s): INR in the last 168 hours.  No results found for: LABCA2  No results found for: ONG295  No results found for: MWU132  No results found for: GMW102  No results found for: CA2729  No components found for: HGQUANT  No results found for: CEA1 / No results found for: CEA1   No results found for: AFPTUMOR  No results found for: CHROMOGRNA  No results found for: PSA1  No visits with results within 3 Day(s)  from this visit.  Latest known visit with results is:  Hospital Outpatient Visit on 09/02/2016  Component Date Value Ref Range Status  . WBC 09/02/2016 5.0  4.0 - 10.5 K/uL Final  . RBC 09/02/2016 4.28  3.87 - 5.11 MIL/uL Final  . Hemoglobin 09/02/2016 13.3  12.0 - 15.0 g/dL Final  . HCT 54/02/8118 39.7  36.0 - 46.0 % Final  . MCV 09/02/2016 92.8  78.0 - 100.0 fL Final  . MCH 09/02/2016 31.1  26.0 - 34.0 pg Final  . MCHC 09/02/2016 33.5  30.0 - 36.0 g/dL Final  . RDW 14/78/2956 12.4  11.5 - 15.5 % Final  . Platelets 09/02/2016 226  150 - 400 K/uL Final  . Preg, Serum 09/02/2016 NEGATIVE  NEGATIVE Final   Comment:        THE SENSITIVITY OF THIS METHODOLOGY IS >10 mIU/mL.     (this displays the last labs from the  last 3 days)  No results found for: TOTALPROTELP, ALBUMINELP, A1GS, A2GS, BETS, BETA2SER, GAMS, MSPIKE, SPEI (this displays SPEP labs)  No results found for: KPAFRELGTCHN, LAMBDASER, KAPLAMBRATIO (kappa/lambda light chains)  No results found for: HGBA, HGBA2QUANT, HGBFQUANT, HGBSQUAN (Hemoglobinopathy evaluation)   No results found for: LDH  No results found for: IRON, TIBC, IRONPCTSAT (Iron and TIBC)  No results found for: FERRITIN  Urinalysis No results found for: COLORURINE, APPEARANCEUR, LABSPEC, PHURINE, GLUCOSEU, HGBUR, BILIRUBINUR, KETONESUR, PROTEINUR, UROBILINOGEN, NITRITE, LEUKOCYTESUR   STUDIES: No results found.  ELIGIBLE FOR AVAILABLE RESEARCH PROTOCOL: no  ASSESSMENT: 42 y.o. [place], [place] Woman s/p left upper outer quadrant lumpectomy 09/09/2016 for a complex sclerosing lesion associated with atypical lobular hyperplasia  (a) breast density category C  (b) two paternal aunts with breast cancer diagnosed in their 26's  (c) tamoxifen started 07/07/2017  PLAN: We spent the better part of today's 50-minute appointment discussing the biology of breast cancer in general, and the specifics of atypical lobular hyperplasia [ALH] more specifically. Nichelle understands ALH means, first, relatively unrestricted growth of breast cells and, in addition, some morphologically different features from simple hyperplasia. Atypical lobular hyperplasia can be associated with neoplasia and for that reason lumpectomy was performed.  More than a breast cancer precursor, however,ALH is a marker of breast cancer risk. The risk of developing breast cancer in patients with ALH falls somewhere between one half and 1% per year. The new cancer can develop in either breast. One half of those tumors would be invasive.   Working from the patient's age to the average survival of women today in the [place] I think Isabela can expect to live an additional 40 years. Using the high end of the risk spectrum for  purposes of discussion, this would give her a 40% chance of developing breast cancer in her lifetime. This is approximately 3 times the normal risk. The risk of an invasive breast cancer developing therefore would be 20%.  Morghan has essentially two ways of lowering that risk. One of them is bilateral mastectomies. This is not recommended for this indication and I do not believe it would be covered by insurance even if she wanted it which she doesn't. In addition it does not improve survival.  The other, more reasonable approach would be to take anti-estrogens. In premenopausal women like Karesa this means tamoxifen or raloxifene. We then discussed the possible toxicities, side effects and complications of the tamoxifen group. We discussed the STAR study in detail. All information was given to [place] in writing. Tamoxifen if taken for 5  years would essentially cut the risk of developing a new breast cancer in half.  More specifically, it would cut her risk of developing an invasive breast cancer from 20% to 19%. This means she would have a 90% chance of NOT developing an invasive breast cancer in her lifetime.  We also discussed intensified screening. This could be adding yearly MRI to yearly mammography with tomography. This approach greatly increases sensitivity. Any breast cancer that may develop should be found at the earliest possible stage. However intensified screening has not been documented to improve survival in this population. There are issues regarding cost even if insurance coverage is obtained. There also concerns regarding false positives especially in the first or second year of MRI screening.  We discussed data that shows mammography every 6 months does not improve survival compared with every 12 months-- but doubles the radiation exposure.  We reviewed the increased sensitivity when tomography ("3D") is added to mammography.  Unfortunately I do not believe she qualifies for genetics  testing--I have asked our genetics counselors to confirm.  Takeysha already has biannual breast exams through her surgeon and PCP. She will have annual mammography with tomography. She will start tamoxifen now and take it for 5 years. I believe this is the best strategy for her and she is comfortable with it.  She will start tamoxifen now. I will see her again in about 3 months to make sure she is tolerating it well. If so I will release her to her PCP but make myself available for questions or concerns in the future.  Nayvie has a good understanding of the overall plan. She agrees with it. She knows the goal of treatment in her case is prevention. She will call with any problems that may develop before her next visit here.   Kayon Dozier, Valentino Hue, MD  07/07/17 4:50 PM Medical Oncology and Hematology [facility] [address] [place], [place] [address] Tel. [phone]    Fax. [phone]  This document serves as a record of services personally performed by Ruthann Cancer, MD. It was created on his behalf by Merideth Abbey, a trained medical scribe. The creation of this record is based on the scribe's personal observations and the provider's statements to them.   I have reviewed the above documentation for accuracy and completeness, and I agree with the above.

## 2017-07-07 ENCOUNTER — Inpatient Hospital Stay: Payer: BLUE CROSS/BLUE SHIELD | Attending: Oncology | Admitting: Oncology

## 2017-07-07 DIAGNOSIS — N6092 Unspecified benign mammary dysplasia of left breast: Secondary | ICD-10-CM | POA: Diagnosis not present

## 2017-07-07 DIAGNOSIS — Z79810 Long term (current) use of selective estrogen receptor modulators (SERMs): Secondary | ICD-10-CM | POA: Insufficient documentation

## 2017-07-07 DIAGNOSIS — Z1239 Encounter for other screening for malignant neoplasm of breast: Secondary | ICD-10-CM | POA: Insufficient documentation

## 2017-07-07 DIAGNOSIS — Z803 Family history of malignant neoplasm of breast: Secondary | ICD-10-CM | POA: Insufficient documentation

## 2017-07-07 MED ORDER — TAMOXIFEN CITRATE 20 MG PO TABS: 20.0000 mg | ORAL_TABLET | Freq: Every day | ORAL | 12 refills | Status: AC

## 2017-07-11 ENCOUNTER — Other Ambulatory Visit: Payer: Self-pay | Admitting: Oncology

## 2017-07-11 ENCOUNTER — Encounter: Payer: BLUE CROSS/BLUE SHIELD | Admitting: Oncology

## 2017-07-11 NOTE — Progress Notes (Signed)
Checked with genetics, does not meet criteria for genetics testing

## 2017-08-04 ENCOUNTER — Ambulatory Visit
Admission: RE | Admit: 2017-08-04 | Discharge: 2017-08-04 | Disposition: A | Discharge status: Home / Self Care | Payer: BLUE CROSS/BLUE SHIELD | Source: Ambulatory Visit | Attending: General Surgery | Admitting: General Surgery

## 2017-08-04 DIAGNOSIS — Z1231 Encounter for screening mammogram for malignant neoplasm of breast: Secondary | ICD-10-CM

## 2017-08-05 ENCOUNTER — Other Ambulatory Visit: Payer: Self-pay | Admitting: General Surgery

## 2017-08-05 DIAGNOSIS — R928 Other abnormal and inconclusive findings on diagnostic imaging of breast: Secondary | ICD-10-CM

## 2017-08-11 ENCOUNTER — Ambulatory Visit
Admission: RE | Admit: 2017-08-11 | Discharge: 2017-08-11 | Disposition: A | Discharge status: Home / Self Care | Payer: BLUE CROSS/BLUE SHIELD | Source: Ambulatory Visit | Attending: General Surgery | Admitting: General Surgery

## 2017-08-11 DIAGNOSIS — R928 Other abnormal and inconclusive findings on diagnostic imaging of breast: Secondary | ICD-10-CM

## 2017-09-08 ENCOUNTER — Ambulatory Visit: Payer: BLUE CROSS/BLUE SHIELD | Admitting: Oncology

## 2017-09-08 ENCOUNTER — Telehealth: Payer: Self-pay | Admitting: Oncology

## 2017-09-08 NOTE — Telephone Encounter (Signed)
Called regarding voicemail °

## 2017-11-10 ENCOUNTER — Telehealth: Payer: Self-pay

## 2017-11-10 ENCOUNTER — Inpatient Hospital Stay: Payer: BLUE CROSS/BLUE SHIELD | Admitting: Oncology

## 2017-11-10 NOTE — Telephone Encounter (Signed)
Patient called and requested to cancel her appointment for today. Per 5/223 phone msg return

## 2018-07-27 ENCOUNTER — Other Ambulatory Visit: Payer: Self-pay | Admitting: Physician Assistant

## 2018-07-27 DIAGNOSIS — Z1231 Encounter for screening mammogram for malignant neoplasm of breast: Secondary | ICD-10-CM

## 2018-08-24 ENCOUNTER — Ambulatory Visit
Admission: RE | Admit: 2018-08-24 | Discharge: 2018-08-24 | Disposition: A | Discharge status: Home / Self Care | Payer: BLUE CROSS/BLUE SHIELD | Source: Ambulatory Visit | Attending: Physician Assistant | Admitting: Physician Assistant

## 2018-08-24 DIAGNOSIS — Z1231 Encounter for screening mammogram for malignant neoplasm of breast: Secondary | ICD-10-CM

## 2019-07-19 ENCOUNTER — Other Ambulatory Visit: Payer: Self-pay | Admitting: Physician Assistant

## 2019-07-19 DIAGNOSIS — Z1231 Encounter for screening mammogram for malignant neoplasm of breast: Secondary | ICD-10-CM

## 2019-08-30 ENCOUNTER — Ambulatory Visit
Admission: RE | Admit: 2019-08-30 | Discharge: 2019-08-30 | Disposition: A | Discharge status: Home / Self Care | Payer: BC Managed Care – PPO | Source: Ambulatory Visit | Attending: Physician Assistant | Admitting: Physician Assistant

## 2019-08-30 ENCOUNTER — Other Ambulatory Visit: Payer: Self-pay

## 2019-08-30 DIAGNOSIS — Z1231 Encounter for screening mammogram for malignant neoplasm of breast: Secondary | ICD-10-CM

## 2020-07-31 ENCOUNTER — Other Ambulatory Visit: Payer: Self-pay | Admitting: Physician Assistant

## 2020-07-31 DIAGNOSIS — Z1231 Encounter for screening mammogram for malignant neoplasm of breast: Secondary | ICD-10-CM

## 2020-09-25 ENCOUNTER — Ambulatory Visit: Payer: BC Managed Care – PPO

## 2020-10-09 ENCOUNTER — Other Ambulatory Visit: Payer: Self-pay

## 2020-10-09 ENCOUNTER — Ambulatory Visit
Admission: RE | Admit: 2020-10-09 | Discharge: 2020-10-09 | Disposition: A | Discharge status: Home / Self Care | Payer: BC Managed Care – PPO | Source: Ambulatory Visit | Attending: Physician Assistant | Admitting: Physician Assistant

## 2020-10-09 DIAGNOSIS — Z1231 Encounter for screening mammogram for malignant neoplasm of breast: Secondary | ICD-10-CM

## 2021-10-12 ENCOUNTER — Other Ambulatory Visit: Payer: Self-pay | Admitting: Physician Assistant

## 2021-10-12 DIAGNOSIS — Z1231 Encounter for screening mammogram for malignant neoplasm of breast: Secondary | ICD-10-CM

## 2021-11-06 ENCOUNTER — Encounter: Payer: Self-pay | Admitting: Gastroenterology

## 2021-11-12 ENCOUNTER — Ambulatory Visit
Admission: RE | Admit: 2021-11-12 | Discharge: 2021-11-12 | Disposition: A | Discharge status: Home / Self Care | Payer: BC Managed Care – PPO | Source: Ambulatory Visit | Attending: Physician Assistant | Admitting: Physician Assistant

## 2021-11-12 DIAGNOSIS — Z1231 Encounter for screening mammogram for malignant neoplasm of breast: Secondary | ICD-10-CM

## 2021-12-10 ENCOUNTER — Ambulatory Visit: Payer: BC Managed Care – PPO | Admitting: Gastroenterology

## 2021-12-10 ENCOUNTER — Encounter: Payer: Self-pay | Admitting: Gastroenterology

## 2021-12-10 VITALS — BP 120/70 | HR 69 | Ht 64.0 in | Wt 154.0 lb

## 2021-12-10 DIAGNOSIS — K219 Gastro-esophageal reflux disease without esophagitis: Secondary | ICD-10-CM

## 2021-12-10 DIAGNOSIS — Z1211 Encounter for screening for malignant neoplasm of colon: Secondary | ICD-10-CM | POA: Diagnosis not present

## 2021-12-10 DIAGNOSIS — Z1212 Encounter for screening for malignant neoplasm of rectum: Secondary | ICD-10-CM | POA: Diagnosis not present

## 2021-12-10 NOTE — Patient Instructions (Addendum)
If you are age 48 or older, your body mass index should be between 23-30. Your Body mass index is 26.43 kg/m. If this is out of the aforementioned range listed, please consider follow up with your Primary Care Provider.  If you are age 63 or younger, your body mass index should be between 19-25. Your Body mass index is 26.43 kg/m. If this is out of the aformentioned range listed, please consider follow up with your Primary Care Provider.   You have been scheduled for an endoscopy and colonoscopy. Please follow the written instructions given to you at your visit today. Please pick up your prep supplies at the pharmacy within the next 1-3 days. If you use inhalers (even only as needed), please bring them with you on the day of your procedure.  Continue Pepcid 40 mg once in the morning and Pantoprazole once at a bedtime.  You may increase Pantoprazole to twice daily with breakthrough reflux.  The [facility] providers would like to encourage you to use [facility] to communicate with providers for non-urgent requests or questions.  Due to long hold times on the telephone, sending your provider a message by [facility] may be a faster and more efficient way to get a response.  Please allow 48 business hours for a response.  Please remember that this is for non-urgent requests.   It was my pleasure to provide care to you today. Based on our discussion, I am providing you with my recommendations below:   Thank you for trusting me with your gastrointestinal care!    Tressia Danas, MD, MPH

## 2022-01-22 ENCOUNTER — Encounter: Payer: Self-pay | Admitting: Gastroenterology

## 2022-01-22 ENCOUNTER — Ambulatory Visit (AMBULATORY_SURGERY_CENTER): Payer: BC Managed Care – PPO | Admitting: Gastroenterology

## 2022-01-22 VITALS — BP 140/83 | HR 70 | Temp 98.9°F | Resp 17 | Ht 64.0 in | Wt 154.0 lb

## 2022-01-22 DIAGNOSIS — K317 Polyp of stomach and duodenum: Secondary | ICD-10-CM

## 2022-01-22 DIAGNOSIS — K3189 Other diseases of stomach and duodenum: Secondary | ICD-10-CM | POA: Diagnosis not present

## 2022-01-22 DIAGNOSIS — K219 Gastro-esophageal reflux disease without esophagitis: Secondary | ICD-10-CM | POA: Diagnosis not present

## 2022-01-22 DIAGNOSIS — K2970 Gastritis, unspecified, without bleeding: Secondary | ICD-10-CM

## 2022-01-22 DIAGNOSIS — K319 Disease of stomach and duodenum, unspecified: Secondary | ICD-10-CM | POA: Diagnosis not present

## 2022-01-22 DIAGNOSIS — Z1211 Encounter for screening for malignant neoplasm of colon: Secondary | ICD-10-CM

## 2022-01-22 MED ORDER — PANTOPRAZOLE SODIUM 40 MG PO TBEC: 40.0000 mg | DELAYED_RELEASE_TABLET | Freq: Two times a day (BID) | ORAL | 3 refills | Status: DC

## 2022-01-22 MED ORDER — OMEPRAZOLE 40 MG PO CPDR: 40.0000 mg | DELAYED_RELEASE_CAPSULE | Freq: Two times a day (BID) | ORAL | 3 refills | Status: AC

## 2022-01-22 MED ORDER — SODIUM CHLORIDE 0.9 % IV SOLN: 500.0000 mL | Freq: Once | INTRAVENOUS | Status: DC

## 2022-01-22 NOTE — Progress Notes (Signed)
A and O x3. Report to RN. Tolerated MAC anesthesia well.Teeth unchanged after procedure. 

## 2022-01-22 NOTE — Progress Notes (Signed)
Called to room to assist during endoscopic procedure.  Patient ID and intended procedure confirmed with present staff. Received instructions for my participation in the procedure from the performing physician.  

## 2022-01-22 NOTE — Progress Notes (Signed)
Referring Provider: Marrian Salvage, PA-C Primary Care Physician:  Marrian Salvage, PA-C  Indication for EGD: Reflux with regurgitation Indication for Colonoscopy:  Colon cancer screening  IMPRESSION:  Reflux with regurgitation Need for colon cancer screening Appropriate candidate for monitored anesthesia care  PLAN: EGD and Colonoscopy in the [facility] today   HPI: Ruth Allison is a 46 y.o. female presents for endoscopic evaluation of reflux with regurgitation and for colon cancer screening.  Diagnosed with hiatal hernia on EGD performed a surgery in [place] a few years ago. On omeprazole daily for years. Recent experiencing escalating reflux and regurgitation of partially digested food. Pepcid 40 mg added in the morning and then pantoprazole in the evening with improved control of symptoms. The patient denies alarm symptoms including anemia, dysphagia, early satiety, hematemesis, hematochezia, melena, odynophagia, and weight loss.     No prior colonoscopy.   She has had external hemorrhoids since the birth of her son. Symptomatic with a fullness and discomfort during times of constipation or diarrhea. No bleeding or itching. She is not interested in surgery but saw the brochure for banding in the waiting room.    Materanl grandfather in his 60s. There is no other known family history of colon cancer or polyps. No family history of stomach cancer or other GI malignancy. No family history of inflammatory bowel disease or celiac.      Past Medical History:  Diagnosis Date   Depression    Goiter    History of gallstones    Hypertension    IBS (irritable bowel syndrome)     Past Surgical History:  Procedure Laterality Date   ABLATION     BREAST EXCISIONAL BIOPSY Left 08/2016   benign   BREAST LUMPECTOMY WITH RADIOACTIVE SEED LOCALIZATION Left 09/09/2016   Procedure: LEFT BREAST LUMPECTOMY WITH RADIOACTIVE SEED LOCALIZATION;  Surgeon: Chevis Pretty III, MD;  Location: [facility] OR;  Service:  General;  Laterality: Left;   CHOLECYSTECTOMY      Current Outpatient Medications  Medication Sig Dispense Refill   busPIRone (BUSPAR) 10 MG tablet Take 10 mg by mouth daily.     escitalopram (LEXAPRO) 10 MG tablet Take 10 mg by mouth every evening.  11   famotidine (PEPCID) 40 MG tablet Take 40 mg by mouth daily.     losartan (COZAAR) 50 MG tablet TK 1 T PO QD  3   No current facility-administered medications for this visit.    Allergies as of 01/22/2022   (No Known Allergies)    Family History  Problem Relation Age of Onset   Breast cancer Paternal Aunt        x 2 50's   Colon cancer Maternal Grandfather      Physical Exam: General:   Alert,  well-nourished, pleasant and cooperative in NAD Head:  Normocephalic and atraumatic. Eyes:  Sclera clear, no icterus.   Conjunctiva pink. Mouth:  No deformity or lesions.   Neck:  Supple; no masses or thyromegaly. Lungs:  Clear throughout to auscultation.   No wheezes. Heart:  Regular rate and rhythm; no murmurs. Abdomen:  Soft, non-tender, nondistended, normal bowel sounds, no rebound or guarding.  Msk:  Symmetrical. No boney deformities LAD: No inguinal or umbilical LAD Extremities:  No clubbing or edema. Neurologic:  Alert and  oriented x4;  grossly nonfocal Skin:  No obvious rash or bruise. Psych:  Alert and cooperative. Normal mood and affect.     Studies/Results: No results found.    Oneal Schoenberger L. Orvan Falconer, MD, MPH  01/22/2022, 1:12 PM

## 2022-01-22 NOTE — Op Note (Addendum)
[facility] Patient Name: Ruth Allison Procedure Date: 01/22/2022 2:03 PM MRN: [id number] Endoscopist: Tressia Danas MD, MD Age: 46 Referring MD:  Date of Birth: 12/12/1975 Gender: Female Account #: [id number] Procedure:                Upper GI endoscopy Indications:              Esophageal reflux symptoms that persist despite                            appropriate therapy Medicines:                Monitored Anesthesia Care Procedure:                Pre-Anesthesia Assessment:                           - Prior to the procedure, a History and Physical                            was performed, and patient medications and                            allergies were reviewed. The patient's tolerance of                            previous anesthesia was also reviewed. The risks                            and benefits of the procedure and the sedation                            options and risks were discussed with the patient.                            All questions were answered, and informed consent                            was obtained. Prior Anticoagulants: The patient has                            taken no previous anticoagulant or antiplatelet                            agents. ASA Grade Assessment: II - A patient with                            mild systemic disease. After reviewing the risks                            and benefits, the patient was deemed in                            satisfactory condition to undergo the procedure.  After obtaining informed consent, the endoscope was                            passed under direct vision. Throughout the                            procedure, the patient's blood pressure, pulse, and                            oxygen saturations were monitored continuously. The                            GIF [id number] was introduced through the                            mouth, and advanced to the third part of  duodenum.                            The upper GI endoscopy was accomplished without                            difficulty. The patient tolerated the procedure                            well. Scope In: Scope Out: Findings:                 LA Grade A (one or more mucosal breaks less than 5                            mm, not extending between tops of 2 mucosal folds)                            esophagitis with no bleeding was found 36 cm from                            the incisors. Biopsies were obtained from the                            mid/proximal and distal esophagus with cold forceps                            for histology of suspected eosinophilic esophagitis.                           Diffuse mild inflammation characterized by                            erythema, friability and granularity was found in                            the gastric body. Biopsies were taken from the  antrum, body, and fundus with a cold forceps for                            histology. Estimated blood loss was minimal.                           Multiple medium sessile polyps were found in the                            gastric fundus and in the gastric body. Biopsies                            were taken of four polyps with a cold forceps for                            histology. Estimated blood loss was minimal.                           A small hiatal hernia was present.                           The examined duodenum was normal. Complications:            No immediate complications. Estimated Blood Loss:     Estimated blood loss was minimal. Impression:               - LA Grade A reflux esophagitis with no bleeding.                            Biopsied.                           - Gastritis. Biopsied.                           - Multiple gastric polyps. Biopsied.                           - Normal examined duodenum. Recommendation:           - Patient has a contact number  available for                            emergencies. The signs and symptoms of potential                            delayed complications were discussed with the                            patient. Return to normal activities tomorrow.                            Written discharge instructions were provided to the                            patient.                           -  Resume previous diet.                           - Continue present medications. Increase omeprazole                            to 40 mg BID. Continue famotidine 40 mg daily.                           - Await pathology results.                           - No aspirin, ibuprofen, naproxen, or other                            non-steroidal anti-inflammatory drugs. Thornton Park MD, MD 01/22/2022 2:35:58 PM This report has been signed electronically.

## 2022-01-22 NOTE — Patient Instructions (Addendum)
INCREASE OMEPRAZOLE TO 40 MG TWICE A DAY.   CONTINUE FAMOTIDINE 40 MG DAILY.   NO ASPIRIN, IBUPROFEN, NAPROXEN OR OTHER NON-STEROIDAL ANTI-INFLAMMATORY DRUGS.    YOU HAD AN ENDOSCOPIC PROCEDURE TODAY AT THE [facility]:   Refer to the procedure report that was given to you for any specific questions about what was found during the examination.  If the procedure report does not answer your questions, please call your gastroenterologist to clarify.  If you requested that your care partner not be given the details of your procedure findings, then the procedure report has been included in a sealed envelope for you to review at your convenience later.  YOU SHOULD EXPECT: Some feelings of bloating in the abdomen. Passage of more gas than usual.  Walking can help get rid of the air that was put into your GI tract during the procedure and reduce the bloating. If you had a lower endoscopy (such as a colonoscopy or flexible sigmoidoscopy) you may notice spotting of blood in your stool or on the toilet paper. If you underwent a bowel prep for your procedure, you may not have a normal bowel movement for a few days.  Please Note:  You might notice some irritation and congestion in your nose or some drainage.  This is from the oxygen used during your procedure.  There is no need for concern and it should clear up in a day or so.  SYMPTOMS TO REPORT IMMEDIATELY:  Following lower endoscopy (colonoscopy or flexible sigmoidoscopy):  Excessive amounts of blood in the stool  Significant tenderness or worsening of abdominal pains  Swelling of the abdomen that is new, acute  Fever of 100F or higher  Following upper endoscopy (EGD)  Vomiting of blood or coffee ground material  New chest pain or pain under the shoulder blades  Painful or persistently difficult swallowing  New shortness of breath  Fever of 100F or higher  Black, tarry-looking stools  For urgent or emergent issues, a  gastroenterologist can be reached at any hour by calling [phone]. Do not use MyChart messaging for urgent concerns.    DIET:  We do recommend a small meal at first, but then you may proceed to your regular diet.  Drink plenty of fluids but you should avoid alcoholic beverages for 24 hours.  ACTIVITY:  You should plan to take it easy for the rest of today and you should NOT DRIVE or use heavy machinery until tomorrow (because of the sedation medicines used during the test).    FOLLOW UP: Our staff will call the number listed on your records the next business day following your procedure.  We will call around 7:15- 8:00 am to check on you and address any questions or concerns that you may have regarding the information given to you following your procedure. If we do not reach you, we will leave a message.  If you develop any symptoms (ie: fever, flu-like symptoms, shortness of breath, cough etc.) before then, please call [phone].  If you test positive for Covid 19 in the 2 weeks post procedure, please call and report this information to [place].    If any biopsies were taken you will be contacted by phone or by letter within the next 1-3 weeks.  Please call us at [phone] if you have not heard about the biopsies in 3 weeks.    SIGNATURES/CONFIDENTIALITY: You and/or your care partner have signed paperwork which will be entered into your electronic medical record.  These signatures attest to the fact that that the information above on your After Visit Summary has been reviewed and is understood.  Full responsibility of the confidentiality of this discharge information lies with you and/or your care-partner.  

## 2022-01-22 NOTE — Op Note (Addendum)
[facility] Patient Name: Ruth Allison Procedure Date: 01/22/2022 1:54 PM MRN: [id number] Endoscopist: Tressia Danas MD, MD Age: 46 Referring MD:  Date of Birth: 1976/03/02 Gender: Female Account #: [id number] Procedure:                Colonoscopy Indications:              Screening for colorectal malignant neoplasm, This                            is the patient's first colonoscopy                           No known family history of colon cancer or polyps Medicines:                Monitored Anesthesia Care Procedure:                Pre-Anesthesia Assessment:                           - Prior to the procedure, a History and Physical                            was performed, and patient medications and                            allergies were reviewed. The patient's tolerance of                            previous anesthesia was also reviewed. The risks                            and benefits of the procedure and the sedation                            options and risks were discussed with the patient.                            All questions were answered, and informed consent                            was obtained. Prior Anticoagulants: The patient has                            taken no previous anticoagulant or antiplatelet                            agents. ASA Grade Assessment: II - A patient with                            mild systemic disease. After reviewing the risks                            and benefits, the patient was deemed in  satisfactory condition to undergo the procedure.                           After obtaining informed consent, the colonoscope                            was passed under direct vision. Throughout the                            procedure, the patient's blood pressure, pulse, and                            oxygen saturations were monitored continuously. The                            Olympus CF-HQ190L  (Serial# [id number]) Colonoscope was                            introduced through the anus and advanced to the the                            cecum, identified by appendiceal orifice and                            ileocecal valve. A second forward view of the right                            colon was performed. The colonoscopy was performed                            with moderate difficulty due to a redundant colon,                            significant looping and a tortuous colon.                            Successful completion of the procedure was aided by                            changing the patient's position and applying                            abdominal pressure. The patient tolerated the                            procedure well. The quality of the bowel                            preparation was good. The ileocecal valve,                            appendiceal orifice, and rectum were photographed. Scope In: 2:37:25 PM Scope Out: 2:55:39 PM Scope Withdrawal Time: 0 hours 15 minutes 25 seconds  Total  Procedure Duration: 0 hours 18 minutes 14 seconds  Findings:                 The perianal exam findings include non-bleeding                            internal hemorrhoids and rectal prolapse.                           A diffuse area of moderate melanosis was found in                            the entire colon.                           The exam was otherwise without abnormality on                            direct and retroflexion views. Complications:            No immediate complications. Estimated Blood Loss:     Estimated blood loss: none. Impression:               - Non-thrombosed internal hemorrhoids found on                            perianal exam.                           - Rectal prolapse.                           - Melanosis in the colon.                           - The examination was otherwise normal on direct                            and retroflexion  views.                           - No specimens collected. Recommendation:           - Patient has a contact number available for                            emergencies. The signs and symptoms of potential                            delayed complications were discussed with the                            patient. Return to normal activities tomorrow.                            Written discharge instructions were provided to the  patient.                           - Resume previous diet.                           - Continue present medications.                           - Repeat colonoscopy in 10 years for surveillance,                            earlier with new symptoms.                           - Emerging evidence supports eating a diet of                            fruits, vegetables, grains, calcium, and yogurt                            while reducing red meat and alcohol may reduce the                            risk of colon cancer.                           - Thank you for allowing me to be involved in your                            colon cancer prevention. Tressia Danas MD, MD 01/22/2022 3:03:41 PM This report has been signed electronically.

## 2022-01-25 ENCOUNTER — Telehealth: Payer: Self-pay

## 2022-01-25 NOTE — Telephone Encounter (Signed)
  Follow up Call-     01/22/2022    1:14 PM  Call back number  Post procedure Call Back phone  # [phone]  Permission to leave phone message Yes     Patient questions:  Do you have a fever, pain , or abdominal swelling? No. Pain Score  0 *  Have you tolerated food without any problems? Yes.    Have you been able to return to your normal activities? Yes.    Do you have any questions about your discharge instructions: Diet   No. Medications  No. Follow up visit  No.  Do you have questions or concerns about your Care? No.  Actions: * If pain score is 4 or above: No action needed, pain <4.

## 2022-08-06 IMAGING — MG MM DIGITAL SCREENING BILAT W/ TOMO AND CAD
6 of 10 series · 6 of 30 positions shown · non-contrast
Comparison: Previous exam(s).

CLINICAL DATA: Screening.

EXAM:
DIGITAL SCREENING BILATERAL MAMMOGRAM WITH TOMOSYNTHESIS AND CAD
TECHNIQUE: Bilateral screening digital craniocaudal and mediolateral oblique
mammograms were obtained. Bilateral screening digital breast
tomosynthesis was performed. The images were evaluated with
computer-aided detection.

[R MLO synth-2D]
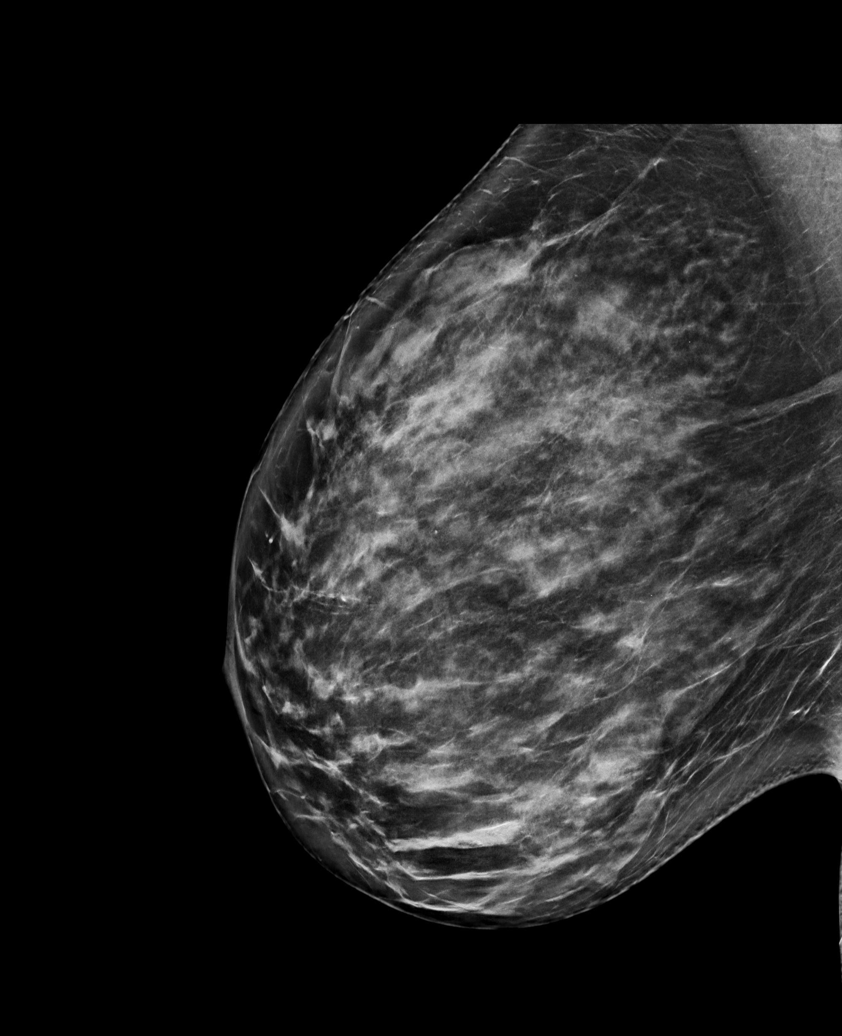

[L MLO synth-2D]
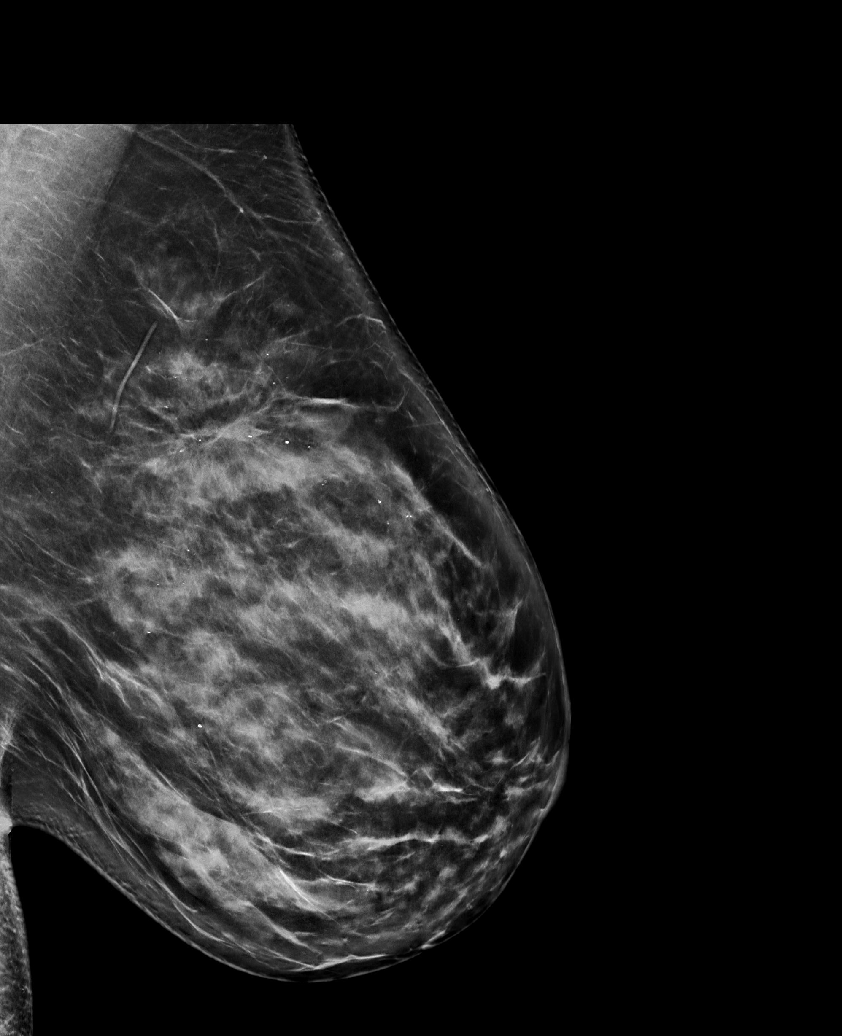

[R CC synth-2D]
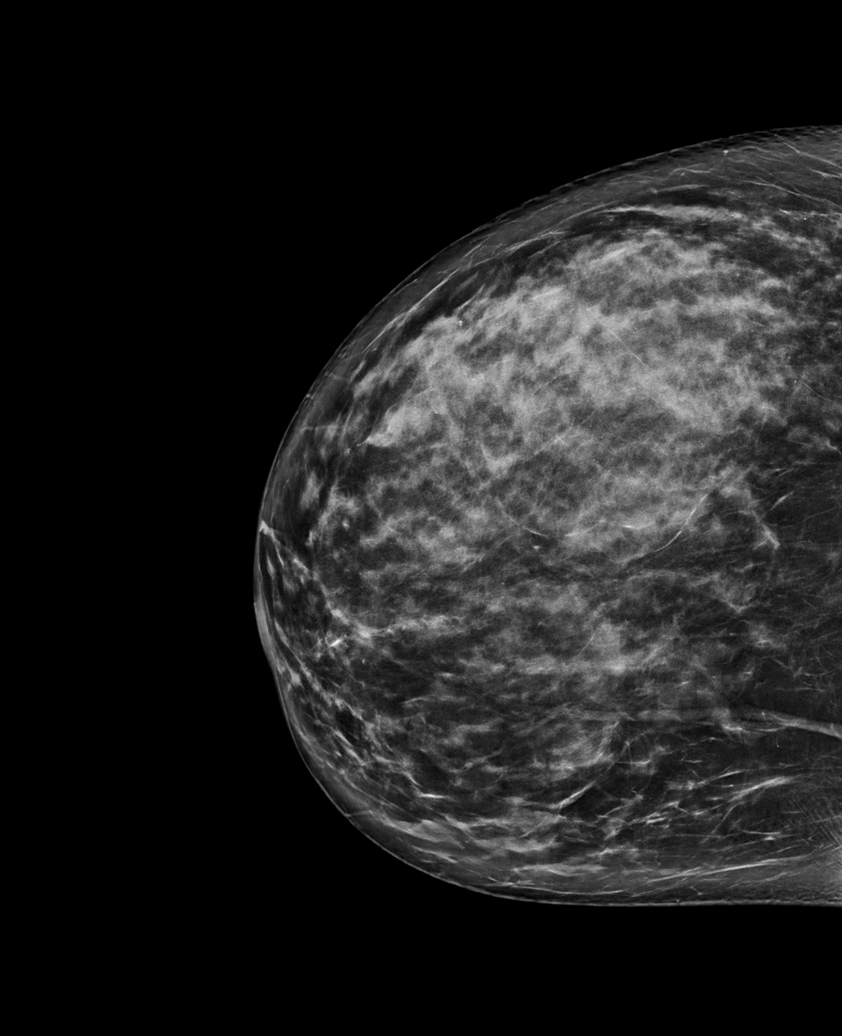

[L CC synth-2D]
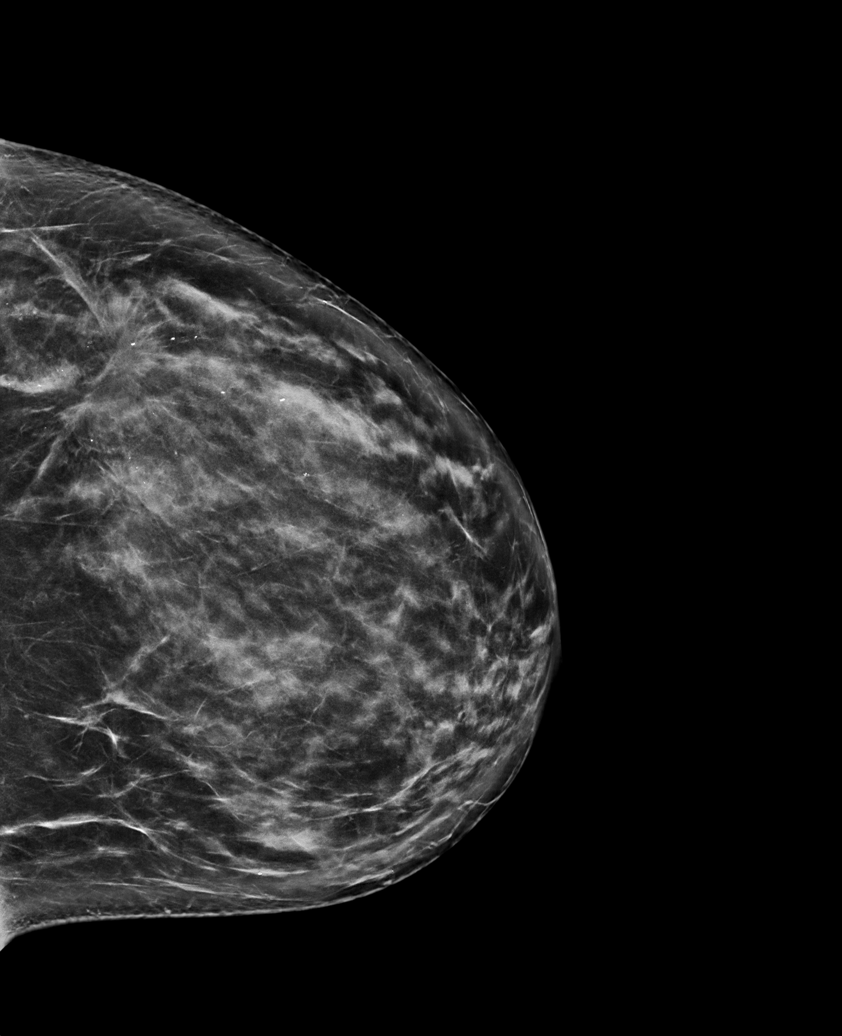

[L XCCL synth-2D]
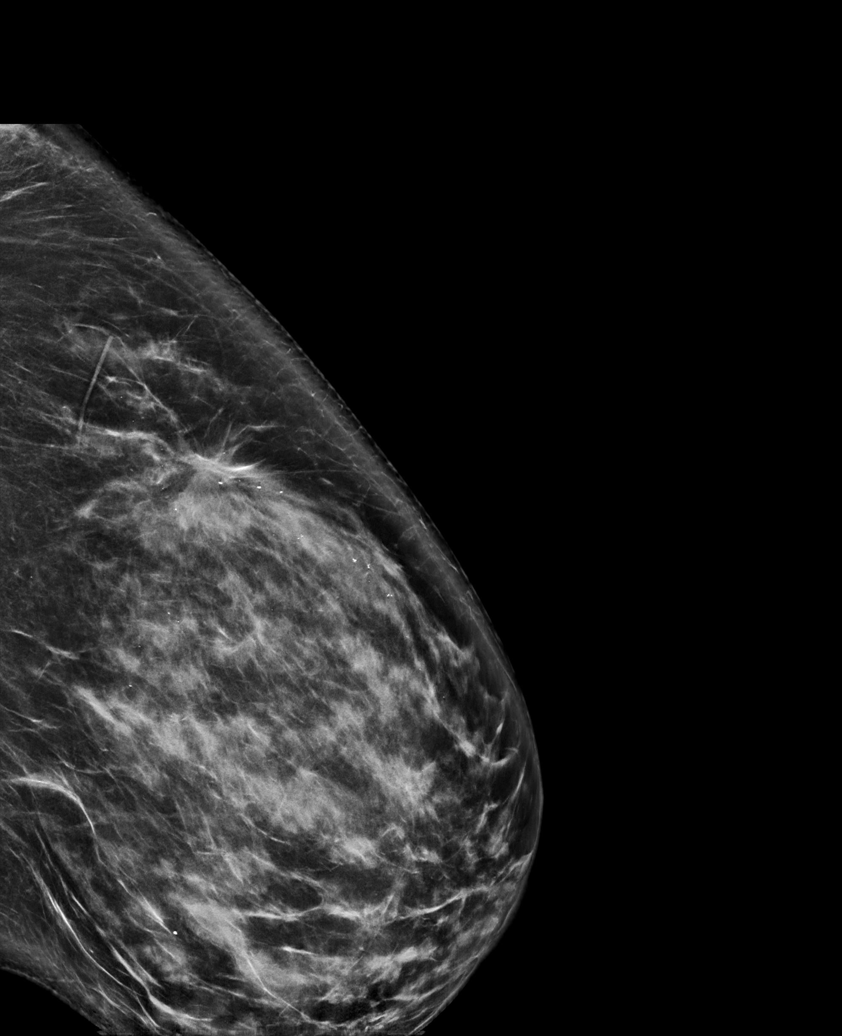

[L XCCL tomo · tomo slice 45/90.0]
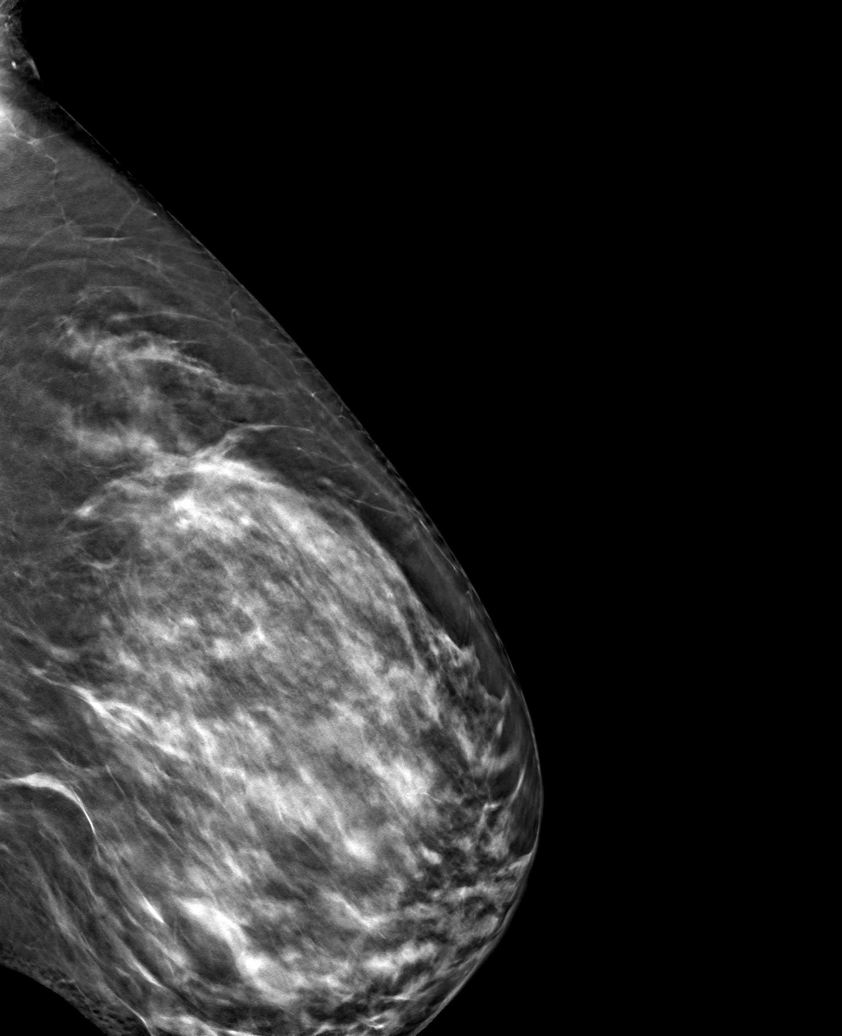

[6 of 30 positions shown; findings below may reference images not displayed]

ACR Breast Density Category c: The breast tissue is heterogeneously
dense, which may obscure small masses.
FINDINGS: There are no findings suspicious for malignancy.
IMPRESSION: No mammographic evidence of malignancy. A result letter of this
screening mammogram will be mailed directly to the patient.

RECOMMENDATION:
Screening mammogram in one year. (Code:Q3-W-BC3)

BI-RADS CATEGORY  1: Negative.

## 2022-08-09 ENCOUNTER — Encounter: Payer: Self-pay | Admitting: Gastroenterology

## 2022-10-12 ENCOUNTER — Other Ambulatory Visit: Payer: Self-pay | Admitting: Physician Assistant

## 2022-10-12 DIAGNOSIS — Z1231 Encounter for screening mammogram for malignant neoplasm of breast: Secondary | ICD-10-CM

## 2022-11-18 ENCOUNTER — Ambulatory Visit
Admission: RE | Admit: 2022-11-18 | Discharge: 2022-11-18 | Disposition: A | Discharge status: Home / Self Care | Payer: BC Managed Care – PPO | Source: Ambulatory Visit | Attending: Physician Assistant | Admitting: Physician Assistant

## 2022-11-18 DIAGNOSIS — Z1231 Encounter for screening mammogram for malignant neoplasm of breast: Secondary | ICD-10-CM

## 2023-10-26 ENCOUNTER — Other Ambulatory Visit: Payer: Self-pay | Admitting: Physician Assistant

## 2023-10-26 DIAGNOSIS — Z1231 Encounter for screening mammogram for malignant neoplasm of breast: Secondary | ICD-10-CM

## 2023-11-24 ENCOUNTER — Ambulatory Visit
Admission: RE | Admit: 2023-11-24 | Discharge: 2023-11-24 | Disposition: A | Discharge status: Home / Self Care | Source: Ambulatory Visit | Attending: Physician Assistant | Admitting: Physician Assistant

## 2023-11-24 DIAGNOSIS — Z1231 Encounter for screening mammogram for malignant neoplasm of breast: Secondary | ICD-10-CM
